# Patient Record
Sex: Male | Born: 1980 | Race: Black or African American | Hispanic: No | Marital: Single | Smoking: Never smoker
Health system: Southern US, Community
[De-identification: ages and names within clinical notes are randomized; demographics above are authoritative.]

---

## 2013-02-03 ENCOUNTER — Emergency Department: Payer: Self-pay | Admitting: Emergency Medicine

## 2018-07-12 ENCOUNTER — Emergency Department: Payer: No Typology Code available for payment source

## 2018-07-12 ENCOUNTER — Encounter: Payer: Self-pay | Admitting: Emergency Medicine

## 2018-07-12 ENCOUNTER — Emergency Department
Admission: EM | Admit: 2018-07-12 | Discharge: 2018-07-12 | Disposition: A | Discharge status: Home / Self Care | Payer: No Typology Code available for payment source | Attending: Emergency Medicine | Admitting: Emergency Medicine

## 2018-07-12 ENCOUNTER — Other Ambulatory Visit: Payer: Self-pay

## 2018-07-12 DIAGNOSIS — K5900 Constipation, unspecified: Secondary | ICD-10-CM | POA: Insufficient documentation

## 2018-07-12 MED ORDER — DOCUSATE SODIUM 50 MG/5ML PO LIQD
50.0000 mg | Freq: Once | ORAL | Status: AC
  Administered 2018-07-12: 50 mg via ORAL
  Filled 2018-07-12: qty 10

## 2018-07-12 MED ORDER — BISACODYL 10 MG RE SUPP
10.0000 mg | Freq: Once | RECTAL | Status: AC
  Administered 2018-07-12: 10 mg via RECTAL
  Filled 2018-07-12: qty 1

## 2018-07-12 MED ORDER — MAGNESIUM CITRATE PO SOLN
1.0000 | Freq: Once | ORAL | Status: AC
  Administered 2018-07-12: 1 via ORAL
  Filled 2018-07-12: qty 296

## 2018-07-12 NOTE — ED Triage Notes (Signed)
Pt presents to ED via POV reports attempted to have BM today without success, states last BM 2 days ago. Pt states he thinks he may have pinworms, reports took laxative at 0600 this morning without success. Pt reports he thinks he may be constipated due to eating a lot of cheese.

## 2018-07-12 NOTE — ED Provider Notes (Signed)
[facility] Emergency Department Provider Note   ____________________________________________   First MD Initiated Contact with Patient 07/12/18 1318     (approximate)  I have reviewed the triage vital signs and the nursing notes.   HISTORY  Chief Complaint Constipation   HPI Danny Nichols is a 38 y.o. male presents to the ED with complaint of constipation.  Patient states that it has been 2 days since his last BM.  Patient states that he attributes this to eating a lot of cheese recently.  He has been on the Internet and is also concerned that he may have pinworms causing his constipation.  He took a laxative at 6 AM today which was Dulcolax tablets.  He denies any abdominal surgery, fever, chills, nausea or vomiting.     History reviewed. No pertinent past medical history.  There are no active problems to display for this patient.   History reviewed. No pertinent surgical history.  Prior to Admission medications   Not on File    Allergies Patient has no known allergies.  History reviewed. No pertinent family history.  Social History Social History   Tobacco Use  . Smoking status: Never Smoker  . Smokeless tobacco: Never Used  Substance Use Topics  . Alcohol use: Yes  . Drug use: Never    Review of Systems Constitutional: No fever/chills Cardiovascular: Denies chest pain. Respiratory: Denies shortness of breath. Gastrointestinal: No abdominal pain.  No nausea, no vomiting.  No diarrhea.  Positive constipation. Musculoskeletal: Negative for back pain. Skin: Negative for rash. Neurological: Negative for headaches. ____________________________________________   PHYSICAL EXAM:  VITAL SIGNS: ED Triage Vitals [07/12/18 1254]  Enc Vitals Group     BP (!) 169/99     Pulse Rate 87     Resp 18     Temp 98.5 F (36.9 C)     Temp Source Oral     SpO2 98 %     Weight 201 lb (91.2 kg)     Height 5\' 11"  (1.803 m)     Head  Circumference      Peak Flow      Pain Score 0     Pain Loc      Pain Edu?      Excl. in GC?    Constitutional: Alert and oriented. Well appearing and in no acute distress. Eyes: Conjunctivae are normal. Head: Atraumatic. Nose: No congestion/rhinnorhea. Mouth/Throat: Mucous membranes are moist.  Oropharynx non-erythematous. Neck: No stridor.   Cardiovascular: Normal rate, regular rhythm. Grossly normal heart sounds.  Good peripheral circulation. Respiratory: Normal respiratory effort.  No retractions. Lungs CTAB. Gastrointestinal: Soft and nontender. No distention.  Bowel sounds are normoactive x4 quadrants. Musculoskeletal: No lower extremity tenderness nor edema.  No joint effusions. Neurologic:  Normal speech and language. No gross focal neurologic deficits are appreciated. No gait instability. Skin:  Skin is warm, dry and intact. No rash noted. Psychiatric: Mood and affect are normal. Speech and behavior are normal.  ____________________________________________   LABS (all labs ordered are listed, but only abnormal results are displayed)  Labs Reviewed - No data to display ____________________________________________  RADIOLOGY  Official radiology report(s): Dg Abdomen 1 View  Result Date: 07/12/2018 CLINICAL DATA:  Constipation for 2 days EXAM: ABDOMEN - 1 VIEW COMPARISON:  None. FINDINGS: There is a moderate amount of stool in the rectum. The amount of fecal material the remainder of the colon is within normal limits. No bowel obstruction. No renal stones noted. Calcifications  in the right side of the pelvis are likely vascular/phleboliths. No other acute abnormalities identified. IMPRESSION: There is a moderate amount of stool in the rectum. No other abnormalities. Electronically Signed   By: Gerome Sam III M.D   On: 07/12/2018 14:10    ____________________________________________   PROCEDURES  Procedure(s) performed (including Critical Care):  Procedures    ____________________________________________   INITIAL IMPRESSION / ASSESSMENT AND PLAN / ED COURSE  As part of my medical decision making, I reviewed the following data within the electronic MEDICAL RECORD NUMBER Notes from prior ED visits and [place] Controlled Substance Database  Patient presents to the ED with complaint of constipation for 2 days.  He has taken a laxative this morning without any relief.  He denies any fever, chills, nausea or vomiting.  X-ray shows stool burden.  Patient was made aware.  He was given mag citrate and Colace mixed together with instructions to drink the entire bottle.  He was also given a Dulcolax suppository.  Patient was made aware that he can obtain [facility] like suppositories over-the-counter at any pharmacy.  He is to increase fluids and also vegetables and fruit.  Patient was given information about constipation.  He is to return to the emergency department if any severe worsening of his symptoms or call his PCP.   ____________________________________________   FINAL CLINICAL IMPRESSION(S) / ED DIAGNOSES  Final diagnoses:  Constipation, unspecified constipation type     ED Discharge Orders    None       Note:  This document was prepared using Dragon voice recognition software and may include unintentional dictation errors.    Tommi Rumps, PA-C 07/12/18 1651    Dionne Bucy, MD 07/13/18 1104

## 2018-07-12 NOTE — Discharge Instructions (Addendum)
Follow-up with your primary care provider or congenital clinic acute care if any continued problems.  Increase fluids.  You may obtain Dulcolax suppositories over-the-counter at any drugstore.  Also repeat the Dulcolax tablets tomorrow if you are still feeling constipated. You may take up to 3 tablets at one time. Also increase fruits and vegetables to increase fiber.

## 2018-07-24 ENCOUNTER — Other Ambulatory Visit
Admission: RE | Admit: 2018-07-24 | Discharge: 2018-07-24 | Disposition: A | Discharge status: Home / Self Care | Payer: No Typology Code available for payment source | Source: Ambulatory Visit | Attending: Gastroenterology | Admitting: Gastroenterology

## 2018-07-24 DIAGNOSIS — R197 Diarrhea, unspecified: Secondary | ICD-10-CM | POA: Insufficient documentation

## 2018-07-24 LAB — GASTROINTESTINAL PANEL BY PCR, STOOL (REPLACES STOOL CULTURE)

## 2018-07-24 LAB — C DIFFICILE QUICK SCREEN W PCR REFLEX
C Diff antigen: NEGATIVE
C Diff toxin: NEGATIVE

## 2018-07-24 LAB — C DIFFICILE QUICK SCREEN W PCR REFLEX??: C Diff interpretation: NOT DETECTED

## 2020-11-13 IMAGING — CR ABDOMEN - 1 VIEW
1 series · 1 of 1 positions shown · non-contrast
Comparison: None.

CLINICAL DATA: Constipation for 2 days

EXAM:
ABDOMEN - 1 VIEW

[dg abd 1 view]
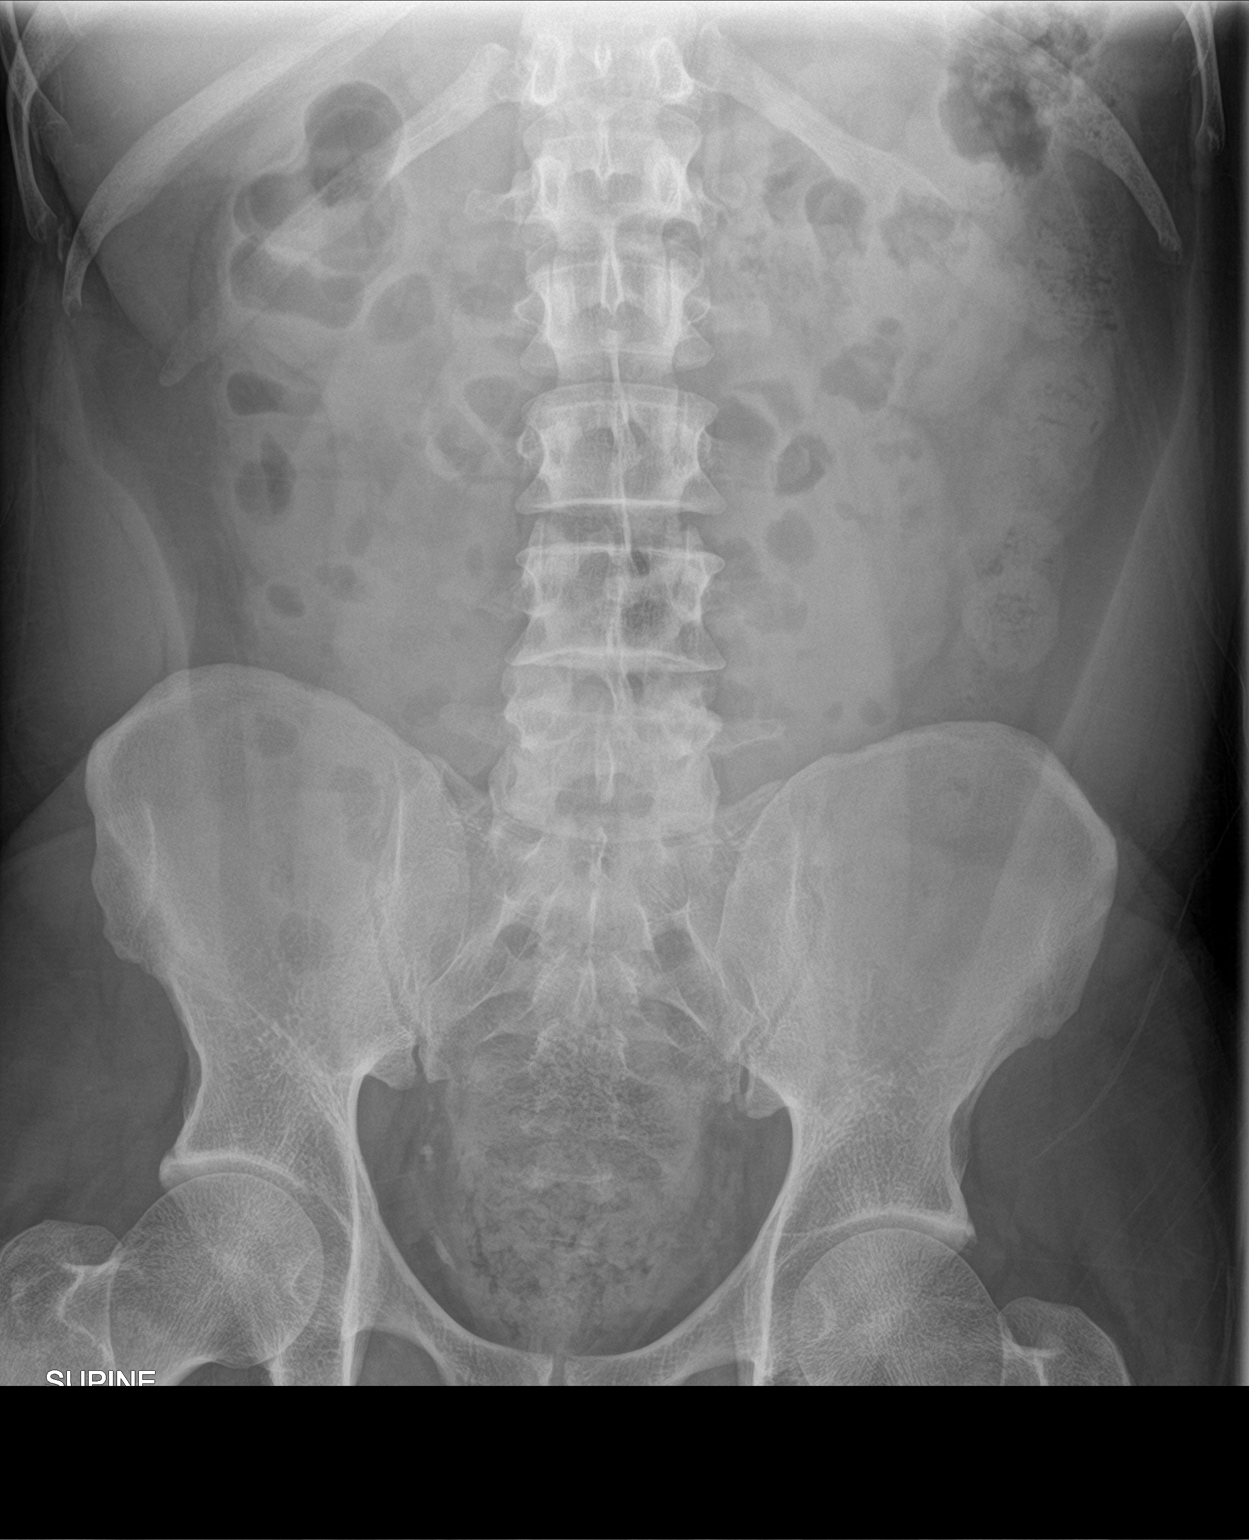

[1 of 1 positions shown; findings below may reference images not displayed]

FINDINGS: There is a moderate amount of stool in the rectum. The amount of
fecal material the remainder of the colon is within normal limits.
No bowel obstruction. No renal stones noted. Calcifications in the
right side of the pelvis are likely vascular/phleboliths. No other
acute abnormalities identified.
IMPRESSION: There is a moderate amount of stool in the rectum. No other
abnormalities.

## 2021-09-13 NOTE — Progress Notes (Deleted)
    I,Sha'taria Cataleya Cristina,acting as a Neurosurgeon for [employer], PA-C.,have documented all relevant documentation on the behalf of Alfredia Ferguson, PA-C,as directed by  Alfredia Ferguson, PA-C while in the presence of Alfredia Ferguson, PA-C.  New patient visit   Patient: Danny Nichols   DOB: March 11, 1981   41 y.o. Male  MRN: [id number] Visit Date: 09/14/2021  Today's healthcare provider: Alfredia Ferguson, PA-C   No chief complaint on file.  Subjective    Danny Nichols is a 41 y.o. male who presents today as a new patient to establish care.  HPI   HIV Screening? Hepatitis C Screening?  No past medical history on file. No past surgical history on file. No family status information on file.   No family history on file. Social History   Socioeconomic History   Marital status: Single    Spouse name: Not on file   Number of children: Not on file   Years of education: Not on file   Highest education level: Not on file  Occupational History   Not on file  Tobacco Use   Smoking status: Never   Smokeless tobacco: Never  Substance and Sexual Activity   Alcohol use: Yes   Drug use: Never   Sexual activity: Not on file  Other Topics Concern   Not on file  Social History Narrative   Not on file   Social Determinants of Health   Financial Resource Strain: Not on file  Food Insecurity: Not on file  Transportation Needs: Not on file  Physical Activity: Not on file  Stress: Not on file  Social Connections: Not on file   No outpatient medications prior to visit.   No facility-administered medications prior to visit.   No Known Allergies  Immunization History  Administered Date(s) Administered   Hepatitis B 02/03/1995, 03/03/1995, 08/11/1995   Td 12/27/2011   Tdap 12/27/2011    Health Maintenance  Topic Date Due   COVID-19 Vaccine (1) Never done   HIV Screening  Never done   Hepatitis C Screening  Never done   INFLUENZA VACCINE  11/13/2021   TETANUS/TDAP   12/26/2021   HPV VACCINES  Aged Out    Patient Care Team: Patient, No Pcp Per (Inactive) as PCP - General (General Practice)  Review of Systems  {Labs  Heme  Chem  Endocrine  Serology  Results Review (optional):23779}   Objective    There were no vitals taken for this visit. {Show previous vital signs (optional):23777}  Physical Exam ***  Depression Screen     View : No data to display.         No results found for any visits on 09/14/21.  Assessment & Plan     ***  No follow-ups on file.     {provider attestation***:1}   Alfredia Ferguson, PA-C  [facility] [phone] (phone) [phone] (fax)  [facility]

## 2021-09-14 ENCOUNTER — Ambulatory Visit: Payer: Self-pay | Admitting: Physician Assistant

## 2021-09-17 ENCOUNTER — Ambulatory Visit: Payer: Self-pay | Admitting: Physician Assistant

## 2021-12-04 ENCOUNTER — Ambulatory Visit: Payer: Self-pay | Admitting: Physician Assistant

## 2021-12-04 NOTE — Progress Notes (Deleted)
    I,Sha'taria Bailyn Spackman,acting as a Neurosurgeon for [employer], PA-C.,have documented all relevant documentation on the behalf of Alfredia Ferguson, PA-C,as directed by  Alfredia Ferguson, PA-C while in the presence of Alfredia Ferguson, PA-C.  New patient visit   Patient: Danny Nichols   DOB: 1980/10/05   41 y.o. Male  MRN: [id number] Visit Date: 12/04/2021  Today's healthcare provider: Alfredia Ferguson, PA-C   No chief complaint on file.  Subjective    Danny Nichols is a 41 y.o. male who presents today as a new patient to establish care.  HPI  ***  No past medical history on file. No past surgical history on file. No family status information on file.   No family history on file. Social History   Socioeconomic History   Marital status: Single    Spouse name: Not on file   Number of children: Not on file   Years of education: Not on file   Highest education level: Not on file  Occupational History   Not on file  Tobacco Use   Smoking status: Never   Smokeless tobacco: Never  Substance and Sexual Activity   Alcohol use: Yes   Drug use: Never   Sexual activity: Not on file  Other Topics Concern   Not on file  Social History Narrative   Not on file   Social Determinants of Health   Financial Resource Strain: Not on file  Food Insecurity: Not on file  Transportation Needs: Not on file  Physical Activity: Not on file  Stress: Not on file  Social Connections: Not on file   No outpatient medications prior to visit.   No facility-administered medications prior to visit.   No Known Allergies  Immunization History  Administered Date(s) Administered   Hepatitis B 02/03/1995, 03/03/1995, 08/11/1995   Td 12/27/2011   Tdap 12/27/2011    Health Maintenance  Topic Date Due   COVID-19 Vaccine (1) Never done   HIV Screening  Never done   Hepatitis C Screening  Never done   INFLUENZA VACCINE  11/13/2021   TETANUS/TDAP  12/26/2021   HPV VACCINES  Aged Out     Patient Care Team: Patient, No Pcp Per as PCP - General (General Practice)  Review of Systems  {Labs  Heme  Chem  Endocrine  Serology  Results Review (optional):23779}   Objective    There were no vitals taken for this visit. {Show previous vital signs (optional):23777}  Physical Exam ***  Depression Screen     No data to display         No results found for any visits on 12/04/21.  Assessment & Plan     ***  No follow-ups on file.     {provider attestation***:1}   Alfredia Ferguson, PA-C  [facility] [phone] (phone) [phone] (fax)  [facility]
